# Patient Record
Sex: Female | Born: 1970 | Race: White | Hispanic: No | Marital: Single
Health system: Southern US, Community
[De-identification: ages and names within clinical notes are randomized; demographics above are authoritative.]

---

## 2004-04-05 ENCOUNTER — Emergency Department (HOSPITAL_COMMUNITY): Admission: EM | Admit: 2004-04-05 | Discharge: 2004-04-05 | Payer: Self-pay | Admitting: Emergency Medicine

## 2008-02-12 ENCOUNTER — Ambulatory Visit: Payer: Self-pay | Admitting: Licensed Clinical Social Worker

## 2008-02-18 ENCOUNTER — Ambulatory Visit: Payer: Self-pay | Admitting: Licensed Clinical Social Worker

## 2008-02-25 ENCOUNTER — Ambulatory Visit: Payer: Self-pay | Admitting: Licensed Clinical Social Worker

## 2008-03-11 ENCOUNTER — Ambulatory Visit: Payer: Self-pay | Admitting: Licensed Clinical Social Worker

## 2008-03-18 ENCOUNTER — Ambulatory Visit: Payer: Self-pay | Admitting: Licensed Clinical Social Worker

## 2009-12-28 ENCOUNTER — Ambulatory Visit: Payer: Self-pay | Admitting: Licensed Clinical Social Worker

## 2010-01-12 ENCOUNTER — Ambulatory Visit: Payer: Self-pay | Admitting: Licensed Clinical Social Worker

## 2010-01-26 ENCOUNTER — Ambulatory Visit: Payer: Self-pay | Admitting: Licensed Clinical Social Worker

## 2010-02-09 ENCOUNTER — Ambulatory Visit: Payer: Self-pay | Admitting: Licensed Clinical Social Worker

## 2010-02-23 ENCOUNTER — Ambulatory Visit: Payer: Self-pay | Admitting: Licensed Clinical Social Worker

## 2011-04-26 ENCOUNTER — Other Ambulatory Visit: Payer: Self-pay | Admitting: Obstetrics and Gynecology

## 2011-04-26 DIAGNOSIS — Z1231 Encounter for screening mammogram for malignant neoplasm of breast: Secondary | ICD-10-CM

## 2011-05-18 ENCOUNTER — Ambulatory Visit
Admission: RE | Admit: 2011-05-18 | Discharge: 2011-05-18 | Disposition: A | Discharge status: Home / Self Care | Payer: BC Managed Care – PPO | Source: Ambulatory Visit | Attending: Obstetrics and Gynecology | Admitting: Obstetrics and Gynecology

## 2011-05-18 DIAGNOSIS — Z1231 Encounter for screening mammogram for malignant neoplasm of breast: Secondary | ICD-10-CM

## 2012-05-28 ENCOUNTER — Other Ambulatory Visit: Payer: Self-pay | Admitting: Obstetrics and Gynecology

## 2012-05-28 DIAGNOSIS — Z1231 Encounter for screening mammogram for malignant neoplasm of breast: Secondary | ICD-10-CM

## 2012-07-09 ENCOUNTER — Ambulatory Visit: Payer: BC Managed Care – PPO

## 2012-07-22 ENCOUNTER — Ambulatory Visit
Admission: RE | Admit: 2012-07-22 | Discharge: 2012-07-22 | Disposition: A | Discharge status: Home / Self Care | Payer: BC Managed Care – PPO | Source: Ambulatory Visit | Attending: Obstetrics and Gynecology | Admitting: Obstetrics and Gynecology

## 2012-07-22 DIAGNOSIS — Z1231 Encounter for screening mammogram for malignant neoplasm of breast: Secondary | ICD-10-CM

## 2013-06-24 ENCOUNTER — Other Ambulatory Visit: Payer: Self-pay

## 2013-06-24 DIAGNOSIS — Z1231 Encounter for screening mammogram for malignant neoplasm of breast: Secondary | ICD-10-CM

## 2013-07-23 ENCOUNTER — Ambulatory Visit
Admission: RE | Admit: 2013-07-23 | Discharge: 2013-07-23 | Disposition: A | Discharge status: Home / Self Care | Payer: Self-pay | Source: Ambulatory Visit

## 2013-07-23 DIAGNOSIS — Z1231 Encounter for screening mammogram for malignant neoplasm of breast: Secondary | ICD-10-CM

## 2014-05-31 ENCOUNTER — Other Ambulatory Visit: Payer: Self-pay

## 2014-05-31 DIAGNOSIS — Z1231 Encounter for screening mammogram for malignant neoplasm of breast: Secondary | ICD-10-CM

## 2014-07-29 ENCOUNTER — Ambulatory Visit
Admission: RE | Admit: 2014-07-29 | Discharge: 2014-07-29 | Disposition: A | Discharge status: Home / Self Care | Payer: BLUE CROSS/BLUE SHIELD | Source: Ambulatory Visit

## 2014-07-29 DIAGNOSIS — Z1231 Encounter for screening mammogram for malignant neoplasm of breast: Secondary | ICD-10-CM

## 2015-04-07 ENCOUNTER — Other Ambulatory Visit: Payer: Self-pay | Admitting: Family Medicine

## 2015-04-07 ENCOUNTER — Ambulatory Visit
Admission: RE | Admit: 2015-04-07 | Discharge: 2015-04-07 | Disposition: A | Discharge status: Home / Self Care | Payer: BLUE CROSS/BLUE SHIELD | Source: Ambulatory Visit | Attending: Family Medicine | Admitting: Family Medicine

## 2015-04-07 DIAGNOSIS — M25552 Pain in left hip: Principal | ICD-10-CM

## 2015-04-07 DIAGNOSIS — M25551 Pain in right hip: Secondary | ICD-10-CM

## 2016-05-24 ENCOUNTER — Other Ambulatory Visit: Payer: Self-pay | Admitting: Nurse Practitioner

## 2016-05-24 DIAGNOSIS — Z1231 Encounter for screening mammogram for malignant neoplasm of breast: Secondary | ICD-10-CM

## 2016-06-15 ENCOUNTER — Ambulatory Visit: Payer: BLUE CROSS/BLUE SHIELD

## 2016-06-15 ENCOUNTER — Ambulatory Visit
Admission: RE | Admit: 2016-06-15 | Discharge: 2016-06-15 | Disposition: A | Discharge status: Home / Self Care | Payer: BLUE CROSS/BLUE SHIELD | Source: Ambulatory Visit | Attending: Nurse Practitioner | Admitting: Nurse Practitioner

## 2016-06-15 DIAGNOSIS — Z1231 Encounter for screening mammogram for malignant neoplasm of breast: Secondary | ICD-10-CM

## 2016-06-22 ENCOUNTER — Other Ambulatory Visit: Payer: Self-pay | Admitting: Nurse Practitioner

## 2016-06-22 DIAGNOSIS — R928 Other abnormal and inconclusive findings on diagnostic imaging of breast: Secondary | ICD-10-CM

## 2016-06-29 ENCOUNTER — Ambulatory Visit
Admission: RE | Admit: 2016-06-29 | Discharge: 2016-06-29 | Disposition: A | Discharge status: Home / Self Care | Payer: BLUE CROSS/BLUE SHIELD | Source: Ambulatory Visit | Attending: Nurse Practitioner | Admitting: Nurse Practitioner

## 2016-06-29 DIAGNOSIS — R928 Other abnormal and inconclusive findings on diagnostic imaging of breast: Secondary | ICD-10-CM

## 2016-10-02 ENCOUNTER — Other Ambulatory Visit: Payer: Self-pay | Admitting: Family Medicine

## 2016-10-02 DIAGNOSIS — Z8249 Family history of ischemic heart disease and other diseases of the circulatory system: Secondary | ICD-10-CM

## 2016-10-08 ENCOUNTER — Other Ambulatory Visit: Payer: BLUE CROSS/BLUE SHIELD

## 2016-10-21 ENCOUNTER — Ambulatory Visit
Admission: RE | Admit: 2016-10-21 | Discharge: 2016-10-21 | Disposition: A | Discharge status: Home / Self Care | Payer: BLUE CROSS/BLUE SHIELD | Source: Ambulatory Visit | Attending: Family Medicine | Admitting: Family Medicine

## 2016-10-21 DIAGNOSIS — Z8249 Family history of ischemic heart disease and other diseases of the circulatory system: Secondary | ICD-10-CM

## 2018-05-30 IMAGING — MR MR MRA HEAD W/O CM
1 series · 23 of 48 positions shown · non-contrast
Comparison: None.

CLINICAL DATA: Family history of aneurysm.

EXAM:
MRA HEAD WITHOUT CONTRAST
TECHNIQUE: Angiographic images of the Circle of Willis were obtained using MRA
technique without intravenous contrast.

[Series 3: [id number] · axial · 0.7mm · 0.35mm/px · z∈[-64,+32]mm · 23 of 144 slices shown]
[im 1/144]
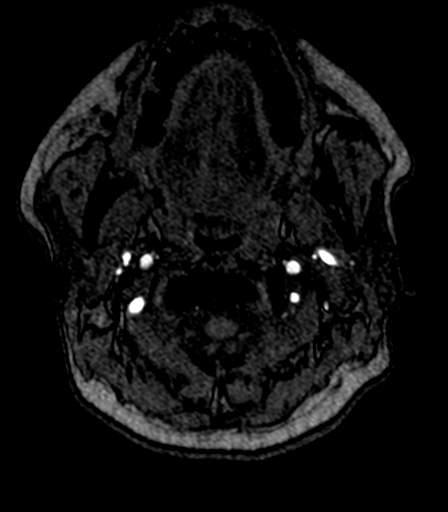
[im 4/144]
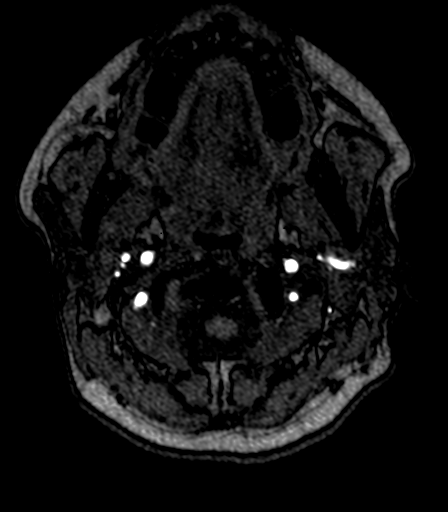
[im 7/144]
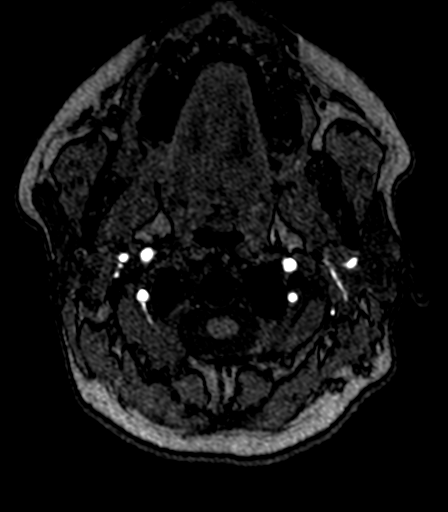
[im 10/144]
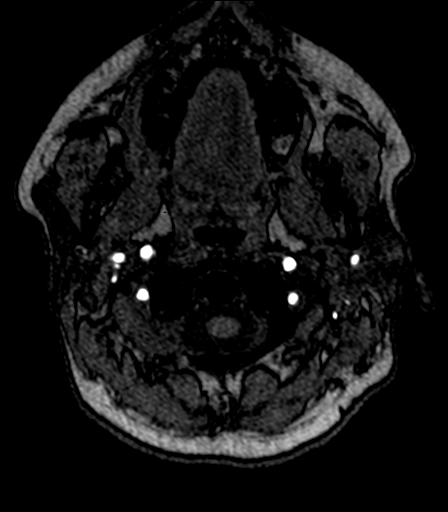
[im 13/144]
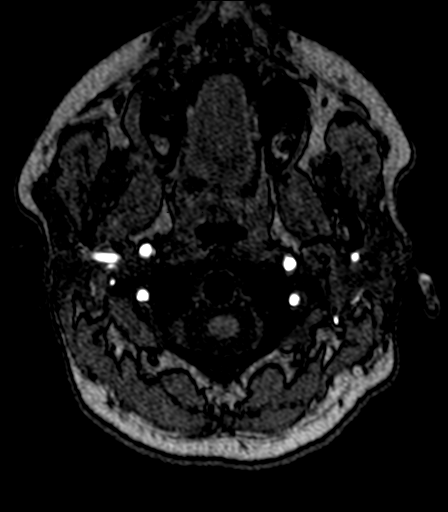
[im 16/144]
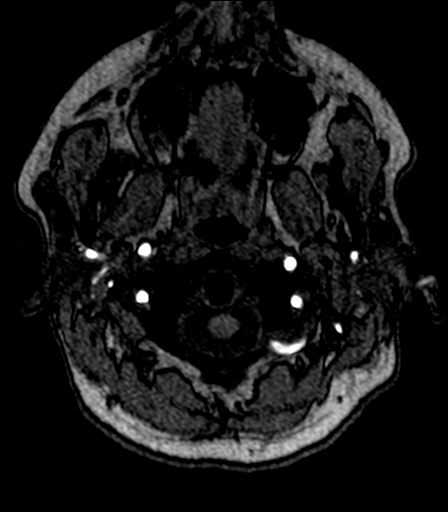
[im 19/144]
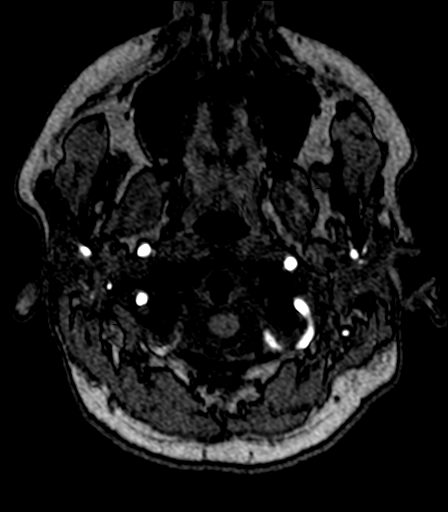
[im 22/144]
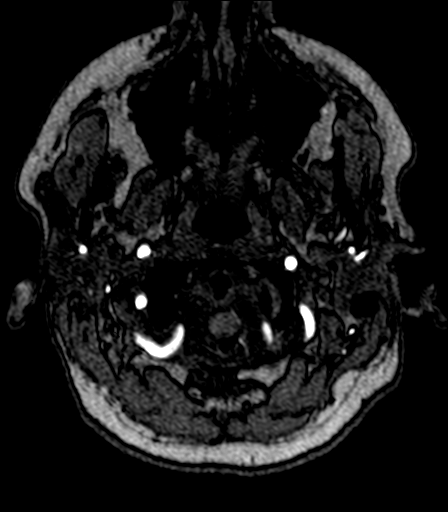
[im 25/144]
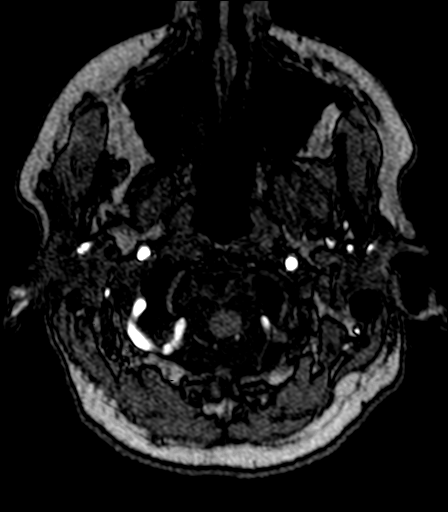
[im 28/144]
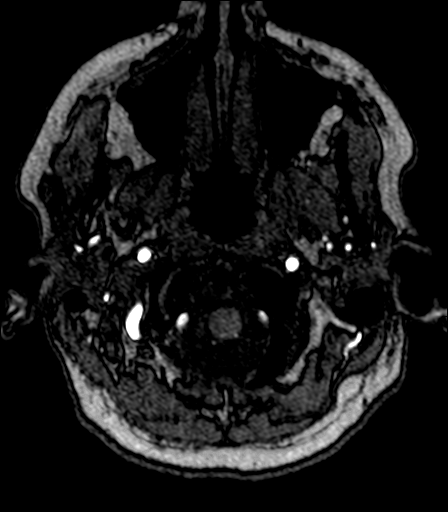
[im 31/144]
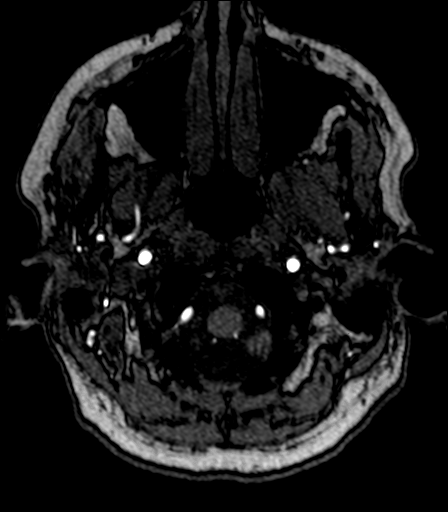
[im 34/144]
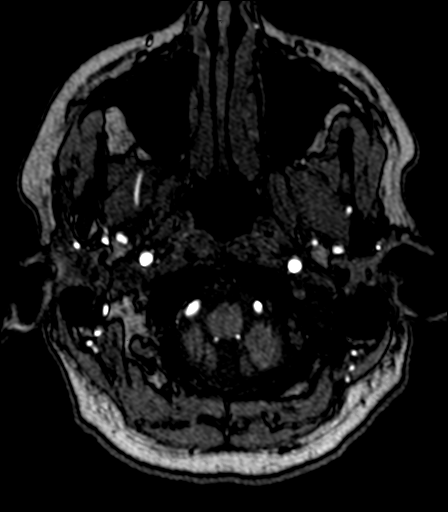
[im 37/144]
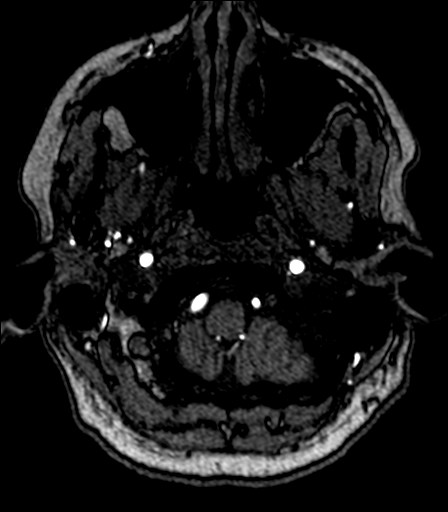
[im 40/144]
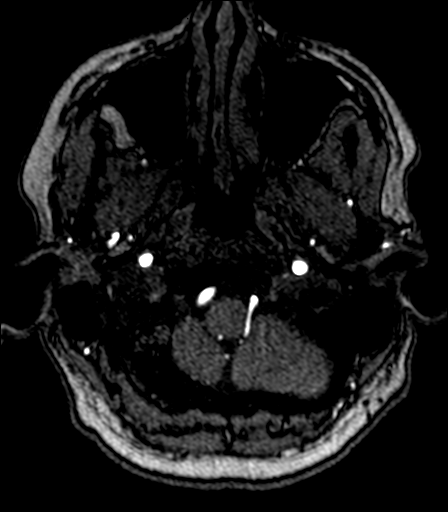
[im 43/144]
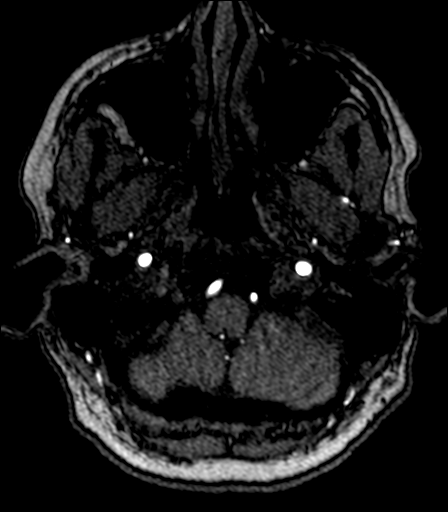
[im 46/144]
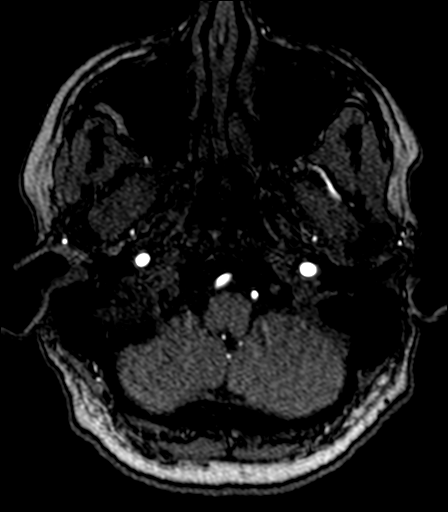
[im 64/144]
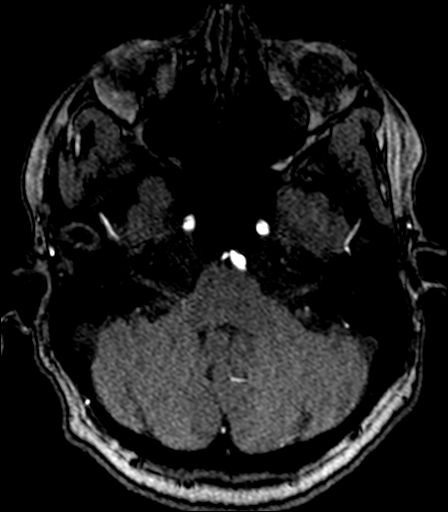
[im 74/144]
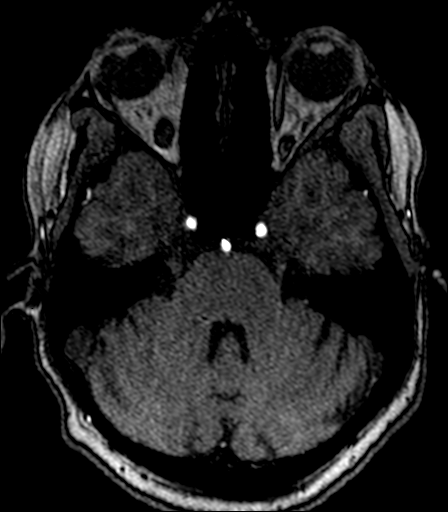
[im 83/144]
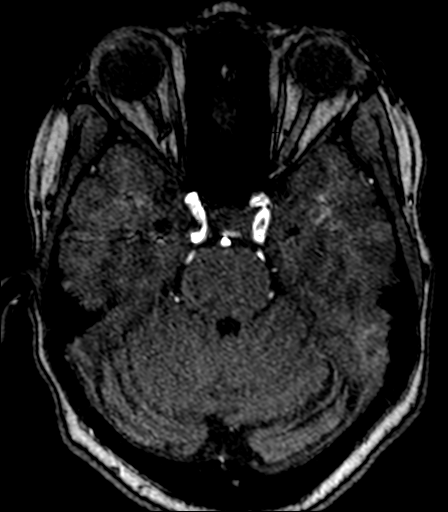
[im 101/144]
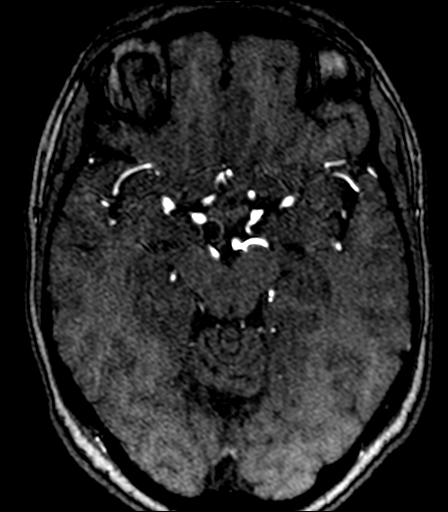
[im 119/144]
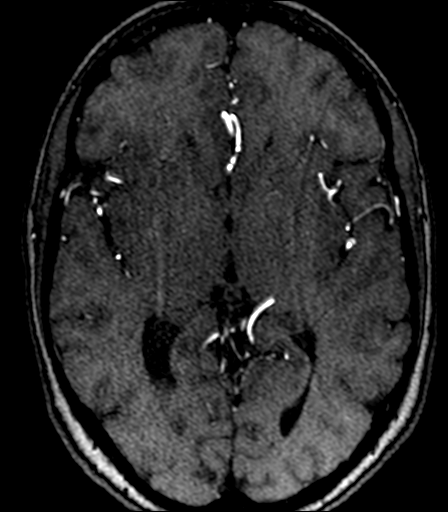
[im 122/144]
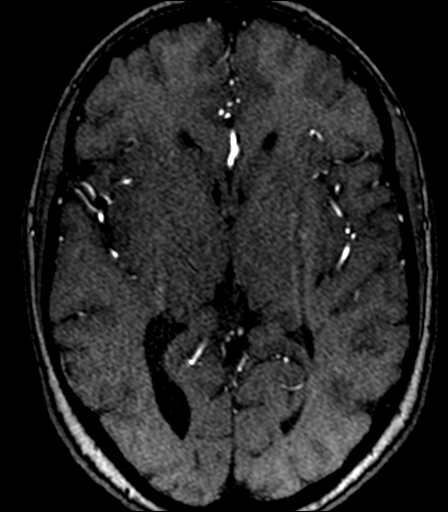
[im 137/144]
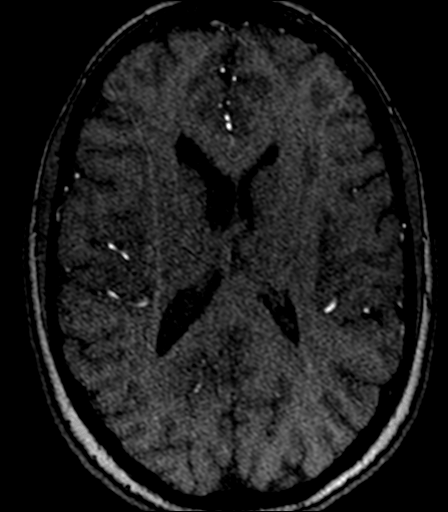

[23 of 48 positions shown; findings below may reference images not displayed]

FINDINGS: The internal carotid arteries are widely patent. The basilar artery
is widely patent with vertebrals codominant. No large vessel
dissection involving the skullbase vasculature.

Codominant anterior cerebral arteries are widely patent. LEFT middle
cerebral artery widely patent. Slight irregularity RIGHT M1 MCA,
without flow-limiting stenosis. No RIGHT/LEFT M2 or M3 branch
stenosis or occlusion.

Posterior cerebral arteries widely patent. No cerebellar branch
occlusion.

No saccular aneurysm at any visualized branch points.
IMPRESSION: Negative for aneurysm or dissection.

Mild irregularity RIGHT M1 MCA could represent intracranial
atherosclerotic disease. No flow-limiting stenosis.

## 2019-09-01 NOTE — Telephone Encounter (Signed)
Error
